# Patient Record
Sex: Male | Born: 1960
Health system: Southern US, Community
[De-identification: ages and names within clinical notes are randomized; demographics above are authoritative.]

## PROBLEM LIST (undated history)

## (undated) DIAGNOSIS — E785 Hyperlipidemia, unspecified: Secondary | ICD-10-CM

## (undated) DIAGNOSIS — I10 Essential (primary) hypertension: Secondary | ICD-10-CM

## (undated) HISTORY — DX: Hyperlipidemia, unspecified: E78.5

## (undated) HISTORY — PX: NO PAST SURGERIES: SHX2092

## (undated) HISTORY — DX: Essential (primary) hypertension: I10

---

## 2004-02-29 ENCOUNTER — Encounter (INDEPENDENT_AMBULATORY_CARE_PROVIDER_SITE_OTHER): Payer: Self-pay | Admitting: Specialist

## 2004-02-29 ENCOUNTER — Other Ambulatory Visit: Admission: RE | Admit: 2004-02-29 | Discharge: 2004-02-29 | Payer: Self-pay | Admitting: Urology

## 2004-02-29 ENCOUNTER — Ambulatory Visit (HOSPITAL_COMMUNITY): Admission: RE | Admit: 2004-02-29 | Discharge: 2004-02-29 | Payer: Self-pay | Admitting: Urology

## 2004-02-29 ENCOUNTER — Ambulatory Visit (HOSPITAL_BASED_OUTPATIENT_CLINIC_OR_DEPARTMENT_OTHER): Admission: RE | Admit: 2004-02-29 | Discharge: 2004-02-29 | Payer: Self-pay | Admitting: Urology

## 2004-07-04 ENCOUNTER — Ambulatory Visit (HOSPITAL_COMMUNITY): Admission: RE | Admit: 2004-07-04 | Discharge: 2004-07-04 | Payer: Self-pay | Admitting: Urology

## 2004-07-04 ENCOUNTER — Encounter (INDEPENDENT_AMBULATORY_CARE_PROVIDER_SITE_OTHER): Payer: Self-pay | Admitting: Specialist

## 2004-07-04 ENCOUNTER — Ambulatory Visit (HOSPITAL_BASED_OUTPATIENT_CLINIC_OR_DEPARTMENT_OTHER): Admission: RE | Admit: 2004-07-04 | Discharge: 2004-07-04 | Payer: Self-pay | Admitting: Urology

## 2006-06-26 ENCOUNTER — Encounter: Admission: RE | Admit: 2006-06-26 | Discharge: 2006-06-26 | Payer: Self-pay | Admitting: Family Medicine

## 2007-08-30 ENCOUNTER — Encounter: Admission: RE | Admit: 2007-08-30 | Discharge: 2007-08-30 | Payer: Self-pay | Admitting: Orthopedic Surgery

## 2011-05-05 NOTE — Op Note (Signed)
NAME:  Richard Butler, Richard Butler                      ACCOUNT NO.:  [id number]   MEDICAL RECORD NO.:  [id number]                   PATIENT TYPE:  AMB   LOCATION:  [facility]                                 FACILITY:  [facility]   PHYSICIAN:  Sigmund I. Patsi Sears, M.D.         DATE OF BIRTH:  1961/04/29   DATE OF PROCEDURE:  07/04/2004  DATE OF DISCHARGE:                                 OPERATIVE REPORT   PREOPERATIVE DIAGNOSIS:  Urethral warts.   POSTOPERATIVE DIAGNOSIS:  Urethral warts.   PROCEDURES PERFORMED:  1. Flexible cystoscopy.  2. Excision of urethral warts using holmium laser.   SURGEON:  Jethro Bolus, M.D.   RESIDENT SURGEON:  Thyra Breed, M.D.   ANESTHESIA:  General endotracheal.   ESTIMATED BLOOD LOSS:  None.   DRAINS:  None.   INDICATION FOR PROCEDURE:  [name] is a 50 year old male with a history  of urethral warts, first resected in 1992.  He then had recurrence in March  of 2005.  He was recently evaluated and found to have recurrent urethral  warts at his fossa navicularis.  He has been consented on the risks,  benefits and alternatives of removal of these warts using the holmium laser.  Informed consent has been obtained.   PROCEDURE IN DETAIL:  Following identification by his arm bracelet, the  patient was brought to the operating room and placed in the supine position.  Here he underwent successful induction of general endotracheal anesthesia  and he was placed in the dorsal lithotomy position.  His perineum and  genitalia were then prepped with Betadine and draped in the usual sterile  fashion.  Flexible cystoscopy was initially carried out to reveal two  urethral warts in the fossa navicularis.  The remainder of the urethra was  without evidence of warts.  Exposing the distal urethra, the base of the  first urethral wart was grasped with a small hemostat, it was then excised.  The base was then further fulgurated using the holmium laser.  The second  wart  was then removed in a similar fashion, again using the holmium laser to  fulgurate the base of the wart in its entirety and obtain excellent  hemostasis.  Following removal of all the wart tissue, we then reinserted  the flexible cystoscope to reveal no evidence of any further warts on the  urothelium.  We then in-and-out cathed the patient with a 14 French red  rubber catheter, this marked termination of procedure.  The patient  tolerated the procedure well and there were no complications.  Please note  that Dr. Patsi Sears was present and participated in the entire procedure as  he was the responsible surgeon.   DISPOSITION:  After awaking from general anesthesia, the patient was  transferred to the postanesthesia care unit in stable condition.  From here,  he will be discharged to home.     Thyra Breed, MD  Sigmund I. Patsi Sears, M.D.    EG/MEDQ  D:  07/04/2004  T:  07/05/2004  Job:  [id number]

## 2011-05-05 NOTE — Op Note (Signed)
NAME:  Richard Butler, Richard Butler                      ACCOUNT NO.:  [id number]   MEDICAL RECORD NO.:  [id number]                   PATIENT TYPE:  AMB   LOCATION:  [facility]                                 FACILITY:  [facility]   PHYSICIAN:  Sigmund I. Patsi Sears, M.D.         DATE OF BIRTH:  07-10-1961   DATE OF PROCEDURE:  02/29/2004  DATE OF DISCHARGE:                                 OPERATIVE REPORT   PREOPERATIVE DIAGNOSIS:  Urethral wart.   POSTOPERATIVE DIAGNOSIS:  Urethral wart.   OPERATION/PROCEDURE:  CO2 laser excision of urethral wart.   SURGEON:  Sigmund I. Patsi Sears, M.D.   ANESTHESIA:  General.   PREPARATION:  After appropriate preanesthesia, the patient was brought to  the operating room and placed on the operating table in the dorsal supine  position where general LMA anesthesia was introduced.  He remained in this  position where the pubis was prepped with Betadine solution and draped in  the usual fashion.   DESCRIPTION OF PROCEDURE:  The distal urethral wart was on a stalk, and use  of the electrocoagulation unit was accomplished to excise the wart.  The CO2  laser was accomplished to vaporize the base of the wart and any remaining  periurethral tissue that appeared to have any wart in it.  The urethra was  probed and there was no further wart noted.  The patient tolerated this  procedure and was awakened and taken to the recovery room in good condition.                                               Sigmund I. Patsi Sears, M.D.    SIT/MEDQ  D:  02/29/2004  T:  02/29/2004  Job:  [id number]

## 2012-12-12 ENCOUNTER — Ambulatory Visit
Admission: RE | Admit: 2012-12-12 | Discharge: 2012-12-12 | Disposition: A | Discharge status: Home / Self Care | Payer: 59 | Source: Ambulatory Visit | Attending: Family Medicine | Admitting: Family Medicine

## 2012-12-12 ENCOUNTER — Other Ambulatory Visit: Payer: Self-pay | Admitting: Family Medicine

## 2012-12-12 DIAGNOSIS — R6889 Other general symptoms and signs: Secondary | ICD-10-CM

## 2013-02-28 ENCOUNTER — Other Ambulatory Visit: Payer: Self-pay | Admitting: Gastroenterology

## 2013-07-23 ENCOUNTER — Ambulatory Visit
Admission: RE | Admit: 2013-07-23 | Discharge: 2013-07-23 | Disposition: A | Discharge status: Home / Self Care | Payer: 59 | Source: Ambulatory Visit | Attending: Family Medicine | Admitting: Family Medicine

## 2013-07-23 ENCOUNTER — Other Ambulatory Visit: Payer: Self-pay | Admitting: Family Medicine

## 2013-07-23 DIAGNOSIS — R509 Fever, unspecified: Secondary | ICD-10-CM

## 2013-07-23 DIAGNOSIS — J189 Pneumonia, unspecified organism: Secondary | ICD-10-CM

## 2013-07-23 DIAGNOSIS — R0602 Shortness of breath: Secondary | ICD-10-CM

## 2013-08-08 ENCOUNTER — Other Ambulatory Visit: Payer: Self-pay | Admitting: Family Medicine

## 2013-08-08 ENCOUNTER — Ambulatory Visit
Admission: RE | Admit: 2013-08-08 | Discharge: 2013-08-08 | Disposition: A | Discharge status: Home / Self Care | Payer: 59 | Source: Ambulatory Visit | Attending: Family Medicine | Admitting: Family Medicine

## 2013-08-08 DIAGNOSIS — J189 Pneumonia, unspecified organism: Secondary | ICD-10-CM

## 2013-08-20 ENCOUNTER — Telehealth: Payer: Self-pay | Admitting: Internal Medicine

## 2013-08-20 NOTE — Telephone Encounter (Signed)
I spoke with pt. appt r/s nothing further needed

## 2013-08-25 ENCOUNTER — Institutional Professional Consult (permissible substitution): Payer: 59 | Admitting: Internal Medicine

## 2013-09-04 ENCOUNTER — Encounter: Payer: Self-pay | Admitting: Internal Medicine

## 2013-09-05 ENCOUNTER — Ambulatory Visit (INDEPENDENT_AMBULATORY_CARE_PROVIDER_SITE_OTHER): Payer: 59 | Admitting: Internal Medicine

## 2013-09-05 ENCOUNTER — Encounter: Payer: Self-pay | Admitting: Internal Medicine

## 2013-09-05 VITALS — BP 130/82 | HR 95 | Temp 98.4°F | Ht 72.0 in | Wt 207.8 lb

## 2013-09-05 DIAGNOSIS — Z23 Encounter for immunization: Secondary | ICD-10-CM

## 2013-09-05 DIAGNOSIS — J189 Pneumonia, unspecified organism: Secondary | ICD-10-CM

## 2013-09-05 NOTE — Progress Notes (Signed)
Subjective:    Patient ID: Richard Butler, male    DOB: 12/29/1960, 52 y.o.   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 Present because of "recurrent pneumonia".  Reports that during his entire childhood and teen years but he always had 2 episodes of sinusitis that occasionally would turn into bronchitis. 7 or 8 years ago he did have one episode of pneumonia. Then in December 2013 had confirmed influenza exposure and himself had influenza. This was followed by development of left lower lobe pneumonia confirmed on chest x-ray 12/12/2012. He says he recovered from that. However those no followup chest x-ray. Then in August 2014 he was a trip to [place] with family and this time did not develop any sinusitis or premonitory respiratory infection but suddenly felt extremely exhausted and weak and presented to his primary care physician early August 2014 and a chest x-ray available for my review 07/23/2013 shows left lower lobe consolidation and pneumonia. Treated with antibiotics and on followup 08/08/2013 chest x-ray available again for my followup for partial resolution of infiltrate. Currently is feeling well and asymptomatic. His main overall health issue is that he needs to lose weight because of hyperlipidemia     Past Medical History  Diagnosis Date  . Hyperlipidemia      Family History  Problem Relation Age of Onset  . Hypertension Father   . Stomach cancer Father   . Heart disease Mother   . Dementia Mother   . Breast cancer Maternal Grandmother   . CAD Father   . Emphysema Father      History   Social History  . Marital Status: Married    Spouse Name: N/A    Number of Children: 1  . Years of Education: N/A   Occupational History  . Attorney    Social History Main Topics  . Smoking status: Former Smoker -- 0.50 packs/day for 15 years    Types: Cigarettes    Quit date: 12/18/2006  . Smokeless tobacco: Not on file   . Alcohol Use: Yes     Comment: occ  . Drug Use: No  . Sexual Activity: Not on file   Other Topics Concern  . Not on file   Social History Narrative  . No narrative on file     Allergies  Allergen Reactions  . Hydrocodone     nightmares  . Tussionex Pennkinetic Er [Hydrocod Polst-Cpm Polst Er]     nightmares     Outpatient Prescriptions Prior to Visit  Medication Sig Dispense Refill  . benzonatate (TESSALON) 100 MG capsule Take 100 mg by mouth 3 (three) times daily as needed for cough.       No facility-administered medications prior to visit.      Review of Systems  Constitutional: Negative for fever and unexpected weight change.  HENT: Negative for ear pain, nosebleeds, congestion, sore throat, rhinorrhea, sneezing, trouble swallowing, dental problem, postnasal drip and sinus pressure.   Eyes: Negative for redness and itching.  Respiratory: Negative for cough, chest tightness, shortness of breath and wheezing.   Cardiovascular: Negative for palpitations and leg swelling.  Gastrointestinal: Negative for nausea and vomiting.  Genitourinary: Negative for dysuria.  Musculoskeletal: Negative for joint swelling.  Skin: Negative for rash.  Neurological: Negative for headaches.  Hematological: Does not bruise/bleed easily.  Psychiatric/Behavioral: Negative for dysphoric mood. The patient is not nervous/anxious.        Objective:   Physical Exam  Nursing note and vitals reviewed. Constitutional: He is oriented to person, place, and time. He appears well-developed and well-nourished. No distress.  HENT:  Head: Normocephalic and atraumatic.  Right Ear: External ear normal.  Left Ear: External ear normal.  Mouth/Throat: Oropharynx is clear and moist. No oropharyngeal exudate.  Eyes: Conjunctivae and EOM are normal. Pupils are equal, round, and reactive to light. Right eye exhibits no discharge. Left eye exhibits no discharge. No scleral icterus.  Neck: Normal range of  motion. Neck supple. No JVD present. No tracheal deviation present. No thyromegaly present.  Cardiovascular: Normal rate, regular rhythm and intact distal pulses.  Exam reveals no gallop and no friction rub.   No murmur heard. Pulmonary/Chest: Effort normal and breath sounds normal. No respiratory distress. He has no wheezes. He has no rales. He exhibits no tenderness.  ? Crackles at base  Abdominal: Soft. Bowel sounds are normal. He exhibits no distension and no mass. There is no tenderness. There is no rebound and no guarding.  Musculoskeletal: Normal range of motion. He exhibits no edema and no tenderness.  Lymphadenopathy:    He has no cervical adenopathy.  Neurological: He is alert and oriented to person, place, and time. He has normal reflexes. No cranial nerve deficit. Coordination normal.  Skin: Skin is warm and dry. No rash noted. He is not diaphoretic. No erythema. No pallor.  Psychiatric: He has a normal mood and affect. His behavior is normal. Judgment and thought content normal.          Assessment & Plan:

## 2013-09-05 NOTE — Patient Instructions (Addendum)
Do ct chest end sept 2014 or early  Oct 2014 Have flu shot 09/05/2013 Will call you with result

## 2013-09-07 DIAGNOSIS — J189 Pneumonia, unspecified organism: Secondary | ICD-10-CM | POA: Insufficient documentation

## 2013-09-07 NOTE — Assessment & Plan Note (Signed)
2  X LLL pneumonia in Dec 2013 and Aug 2014 at same location on CXR. Unclear if there is focoal lesion like bronchiectastis. Currently asymptomatic. Will get followup imaging in the form of CT chest in end sept 2014 or early Oct 2014 . We will call him with results  He is agreeable with plan

## 2013-09-16 ENCOUNTER — Ambulatory Visit (INDEPENDENT_AMBULATORY_CARE_PROVIDER_SITE_OTHER)
Admission: RE | Admit: 2013-09-16 | Discharge: 2013-09-16 | Disposition: A | Discharge status: Home / Self Care | Payer: 59 | Source: Ambulatory Visit | Attending: Internal Medicine | Admitting: Internal Medicine

## 2013-09-16 DIAGNOSIS — J189 Pneumonia, unspecified organism: Secondary | ICD-10-CM

## 2017-01-12 DIAGNOSIS — L02212 Cutaneous abscess of back [any part, except buttock]: Secondary | ICD-10-CM | POA: Diagnosis not present

## 2017-01-12 DIAGNOSIS — L0889 Other specified local infections of the skin and subcutaneous tissue: Secondary | ICD-10-CM | POA: Diagnosis not present

## 2017-02-14 DIAGNOSIS — R112 Nausea with vomiting, unspecified: Secondary | ICD-10-CM | POA: Diagnosis not present

## 2017-02-14 DIAGNOSIS — B349 Viral infection, unspecified: Secondary | ICD-10-CM | POA: Diagnosis not present

## 2017-02-27 DIAGNOSIS — Z0000 Encounter for general adult medical examination without abnormal findings: Secondary | ICD-10-CM | POA: Diagnosis not present

## 2017-03-06 DIAGNOSIS — E784 Other hyperlipidemia: Secondary | ICD-10-CM | POA: Diagnosis not present

## 2017-03-06 DIAGNOSIS — Z1389 Encounter for screening for other disorder: Secondary | ICD-10-CM | POA: Diagnosis not present

## 2017-03-06 DIAGNOSIS — R5383 Other fatigue: Secondary | ICD-10-CM | POA: Diagnosis not present

## 2017-03-08 DIAGNOSIS — Z1212 Encounter for screening for malignant neoplasm of rectum: Secondary | ICD-10-CM | POA: Diagnosis not present

## 2017-05-22 DIAGNOSIS — R52 Pain, unspecified: Secondary | ICD-10-CM | POA: Diagnosis not present

## 2017-05-22 DIAGNOSIS — J069 Acute upper respiratory infection, unspecified: Secondary | ICD-10-CM | POA: Diagnosis not present

## 2017-05-22 DIAGNOSIS — J029 Acute pharyngitis, unspecified: Secondary | ICD-10-CM | POA: Diagnosis not present

## 2017-09-29 DIAGNOSIS — Z23 Encounter for immunization: Secondary | ICD-10-CM | POA: Diagnosis not present

## 2017-10-01 DIAGNOSIS — R0781 Pleurodynia: Secondary | ICD-10-CM | POA: Diagnosis not present

## 2017-10-09 DIAGNOSIS — M9901 Segmental and somatic dysfunction of cervical region: Secondary | ICD-10-CM | POA: Diagnosis not present

## 2017-10-09 DIAGNOSIS — M25511 Pain in right shoulder: Secondary | ICD-10-CM | POA: Diagnosis not present

## 2017-10-09 DIAGNOSIS — M9902 Segmental and somatic dysfunction of thoracic region: Secondary | ICD-10-CM | POA: Diagnosis not present

## 2017-10-11 DIAGNOSIS — M25511 Pain in right shoulder: Secondary | ICD-10-CM | POA: Diagnosis not present

## 2017-10-11 DIAGNOSIS — M9902 Segmental and somatic dysfunction of thoracic region: Secondary | ICD-10-CM | POA: Diagnosis not present

## 2017-10-11 DIAGNOSIS — M9901 Segmental and somatic dysfunction of cervical region: Secondary | ICD-10-CM | POA: Diagnosis not present

## 2017-10-15 DIAGNOSIS — M9902 Segmental and somatic dysfunction of thoracic region: Secondary | ICD-10-CM | POA: Diagnosis not present

## 2017-10-15 DIAGNOSIS — M25511 Pain in right shoulder: Secondary | ICD-10-CM | POA: Diagnosis not present

## 2017-10-15 DIAGNOSIS — M9901 Segmental and somatic dysfunction of cervical region: Secondary | ICD-10-CM | POA: Diagnosis not present

## 2017-12-31 DIAGNOSIS — L821 Other seborrheic keratosis: Secondary | ICD-10-CM | POA: Diagnosis not present

## 2017-12-31 DIAGNOSIS — L820 Inflamed seborrheic keratosis: Secondary | ICD-10-CM | POA: Diagnosis not present

## 2018-03-08 DIAGNOSIS — R82998 Other abnormal findings in urine: Secondary | ICD-10-CM | POA: Diagnosis not present

## 2018-03-08 DIAGNOSIS — Z0000 Encounter for general adult medical examination without abnormal findings: Secondary | ICD-10-CM | POA: Diagnosis not present

## 2018-03-15 DIAGNOSIS — J069 Acute upper respiratory infection, unspecified: Secondary | ICD-10-CM | POA: Diagnosis not present

## 2018-03-15 DIAGNOSIS — Z1389 Encounter for screening for other disorder: Secondary | ICD-10-CM | POA: Diagnosis not present

## 2018-03-15 DIAGNOSIS — R945 Abnormal results of liver function studies: Secondary | ICD-10-CM | POA: Diagnosis not present

## 2018-03-15 DIAGNOSIS — E559 Vitamin D deficiency, unspecified: Secondary | ICD-10-CM | POA: Diagnosis not present

## 2018-03-15 DIAGNOSIS — Z0000 Encounter for general adult medical examination without abnormal findings: Secondary | ICD-10-CM | POA: Diagnosis not present

## 2018-03-15 DIAGNOSIS — Z125 Encounter for screening for malignant neoplasm of prostate: Secondary | ICD-10-CM | POA: Diagnosis not present

## 2018-04-15 DIAGNOSIS — Z8719 Personal history of other diseases of the digestive system: Secondary | ICD-10-CM | POA: Diagnosis not present

## 2019-01-21 DIAGNOSIS — R918 Other nonspecific abnormal finding of lung field: Secondary | ICD-10-CM | POA: Diagnosis not present

## 2019-04-25 DIAGNOSIS — Z0000 Encounter for general adult medical examination without abnormal findings: Secondary | ICD-10-CM | POA: Diagnosis not present

## 2019-05-02 DIAGNOSIS — R82998 Other abnormal findings in urine: Secondary | ICD-10-CM | POA: Diagnosis not present

## 2019-05-07 ENCOUNTER — Other Ambulatory Visit: Payer: Self-pay | Admitting: Internal Medicine

## 2019-05-07 DIAGNOSIS — E785 Hyperlipidemia, unspecified: Secondary | ICD-10-CM

## 2019-06-16 ENCOUNTER — Ambulatory Visit
Admission: RE | Admit: 2019-06-16 | Discharge: 2019-06-16 | Disposition: A | Discharge status: Home / Self Care | Payer: No Typology Code available for payment source | Source: Ambulatory Visit | Attending: Internal Medicine | Admitting: Internal Medicine

## 2019-06-16 DIAGNOSIS — E785 Hyperlipidemia, unspecified: Secondary | ICD-10-CM

## 2020-02-28 ENCOUNTER — Ambulatory Visit: Payer: 59 | Attending: Internal Medicine

## 2020-02-28 DIAGNOSIS — Z23 Encounter for immunization: Secondary | ICD-10-CM

## 2020-02-28 NOTE — Progress Notes (Signed)
   Covid-19 Vaccination Clinic  Name:  Richard Butler    MRN: [id number] DOB: 01/02/1961  02/28/2020  Mr. Plain was observed post Covid-19 immunization for 15 minutes without incident. He was provided with Vaccine Information Sheet and instruction to access the V-Safe system.   [name] was instructed to call 911 with any severe reactions post vaccine: Marland Kitchen Difficulty breathing  . Swelling of face and throat  . A fast heartbeat  . A bad rash all over body  . Dizziness and weakness   Immunizations Administered    Name Date Dose VIS Date Route   Pfizer COVID-19 Vaccine 02/28/2020  1:51 PM 0.3 mL 11/28/2019 Intramuscular   Manufacturer: [employer], [employer]   Lot: [id number]   NDC: [address]-4

## 2020-03-12 ENCOUNTER — Ambulatory Visit: Payer: 59 | Attending: Internal Medicine

## 2020-03-12 DIAGNOSIS — Z20822 Contact with and (suspected) exposure to covid-19: Secondary | ICD-10-CM

## 2020-03-13 LAB — SARS-COV-2, NAA 2 DAY TAT

## 2020-03-13 LAB — NOVEL CORONAVIRUS, NAA: SARS-CoV-2, NAA: DETECTED — AB

## 2020-03-14 ENCOUNTER — Other Ambulatory Visit: Payer: Self-pay | Admitting: Physician Assistant

## 2020-03-14 DIAGNOSIS — U071 COVID-19: Secondary | ICD-10-CM

## 2020-03-14 DIAGNOSIS — I10 Essential (primary) hypertension: Secondary | ICD-10-CM

## 2020-03-14 MED ORDER — SODIUM CHLORIDE 0.9 % IV SOLN
700.0000 mg | Freq: Once | INTRAVENOUS | Status: AC
  Administered 2020-03-15: 700 mg via INTRAVENOUS
  Filled 2020-03-14: qty 700

## 2020-03-14 NOTE — Progress Notes (Signed)
  I connected by phone with Richard Butler on 03/14/2020 at 8:38 AM to discuss the potential use of an new treatment for mild to moderate COVID-19 viral infection in non-hospitalized patients.  This patient is a 59 y.o. male that meets the FDA criteria for Emergency Use Authorization of bamlanivimab or casirivimab\imdevimab.  Has a (+) direct SARS-CoV-2 viral test result  Has mild or moderate COVID-19   Is ? 58 years of age and weighs ? 40 kg  Is NOT hospitalized due to COVID-19  Is NOT requiring oxygen therapy or requiring an increase in baseline oxygen flow rate due to COVID-19  Is within 10 days of symptom onset  Has at least one of the high risk factor(s) for progression to severe COVID-19 and/or hospitalization as defined in EUA.  Specific high risk criteria : Hypertension   I have spoken and communicated the following to the patient or parent/caregiver:  1. FDA has authorized the emergency use of bamlanivimab and casirivimab\imdevimab for the treatment of mild to moderate COVID-19 in adults and pediatric patients with positive results of direct SARS-CoV-2 viral testing who are 88 years of age and older weighing at least 40 kg, and who are at high risk for progressing to severe COVID-19 and/or hospitalization.  2. The significant known and potential risks and benefits of bamlanivimab and casirivimab\imdevimab, and the extent to which such potential risks and benefits are unknown.  3. Information on available alternative treatments and the risks and benefits of those alternatives, including clinical trials.  4. Patients treated with bamlanivimab and casirivimab\imdevimab should continue to self-isolate and use infection control measures (e.g., wear mask, isolate, social distance, avoid sharing personal items, clean and disinfect "high touch" surfaces, and frequent handwashing) according to CDC guidelines.   5. The patient or parent/caregiver has the option to accept or refuse  bamlanivimab or casirivimab\imdevimab .  After reviewing this information with the patient, The patient agreed to proceed with receiving the bamlanimivab infusion and will be provided a copy of the Fact sheet prior to receiving the infusion.   Sx onset 03/11/20. Infusion set up for tomorrow 03/15/20 @ 10:30am. Directions given and orders in.   Cline Crock PA-C 03/14/2020 8:38 AM

## 2020-03-15 ENCOUNTER — Ambulatory Visit (HOSPITAL_COMMUNITY)
Admission: RE | Admit: 2020-03-15 | Discharge: 2020-03-15 | Disposition: A | Discharge status: Home / Self Care | Payer: 59 | Source: Ambulatory Visit | Attending: Pulmonary Disease | Admitting: Pulmonary Disease

## 2020-03-15 DIAGNOSIS — U071 COVID-19: Secondary | ICD-10-CM | POA: Insufficient documentation

## 2020-03-15 DIAGNOSIS — I10 Essential (primary) hypertension: Secondary | ICD-10-CM | POA: Insufficient documentation

## 2020-03-15 MED ORDER — METHYLPREDNISOLONE SODIUM SUCC 125 MG IJ SOLR: 125.0000 mg | Freq: Once | INTRAMUSCULAR | Status: DC | PRN

## 2020-03-15 MED ORDER — DIPHENHYDRAMINE HCL 50 MG/ML IJ SOLN: 50.0000 mg | Freq: Once | INTRAMUSCULAR | Status: DC | PRN

## 2020-03-15 MED ORDER — ALBUTEROL SULFATE HFA 108 (90 BASE) MCG/ACT IN AERS: 2.0000 | INHALATION_SPRAY | Freq: Once | RESPIRATORY_TRACT | Status: DC | PRN

## 2020-03-15 MED ORDER — EPINEPHRINE 0.3 MG/0.3ML IJ SOAJ: 0.3000 mg | Freq: Once | INTRAMUSCULAR | Status: DC | PRN

## 2020-03-15 MED ORDER — FAMOTIDINE IN NACL 20-0.9 MG/50ML-% IV SOLN: 20.0000 mg | Freq: Once | INTRAVENOUS | Status: DC | PRN

## 2020-03-15 MED ORDER — SODIUM CHLORIDE 0.9 % IV SOLN: INTRAVENOUS | Status: DC | PRN

## 2020-03-15 NOTE — Discharge Instructions (Signed)

## 2020-03-15 NOTE — Progress Notes (Signed)
  Diagnosis: COVID-19  Physician: dr wright  Procedure: Covid Infusion Clinic Med: bamlanivimab infusion - Provided patient with bamlanimivab fact sheet for patients, parents and caregivers prior to infusion.  Complications: No immediate complications noted.  Discharge: Discharged home   Jyair Kiraly S Carleigh Buccieri 03/15/2020  

## 2020-03-24 ENCOUNTER — Ambulatory Visit: Payer: No Typology Code available for payment source

## 2020-06-17 ENCOUNTER — Ambulatory Visit: Payer: 59 | Attending: Internal Medicine

## 2020-06-17 DIAGNOSIS — Z23 Encounter for immunization: Secondary | ICD-10-CM

## 2020-06-17 NOTE — Progress Notes (Signed)
   Covid-19 Vaccination Clinic  Name:  Richard Butler    MRN: [id number] DOB: 1961-07-02  06/17/2020  Mr. Richard Butler was observed post Covid-19 immunization for 15 minutes without incident. He was provided with Vaccine Information Sheet and instruction to access the V-Safe system.   Richard Butler was instructed to call 911 with any severe reactions post vaccine: Richard Butler Kitchen Difficulty breathing  . Swelling of face and throat  . A fast heartbeat  . A bad rash all over body  . Dizziness and weakness   Immunizations Administered    Name Date Dose VIS Date Route   Pfizer COVID-19 Vaccine 06/17/2020  4:44 PM 0.3 mL 02/11/2019 Intramuscular   Manufacturer: [employer], [employer]   Lot: [id number]   NDC: [address]-5

## 2020-11-01 IMAGING — CT CT HEART SCORING
3 series · 14 of 20 positions shown, 16 images · non-contrast
Comparison: Chest CT 09/16/2013.

CLINICAL DATA: 58-year-old Caucasian male with family history of
heart disease. Hypertension and hyperlipidemia.

EXAM:
CT HEART FOR CALCIUM SCORING
TECHNIQUE: CT heart was performed using prospective ECG gating.
A non-contrast exam for calcium scoring was performed.
Note that this exam targets the heart and the chest was not imaged
in its entirety.

[Series 2: calcium scoring 2.00 qr36 bestdiast 71% · axial · 0.40mm/px · z∈[+1546,+1616]mm · 4 of 59 slices shown]
[im 12/59  vessel]
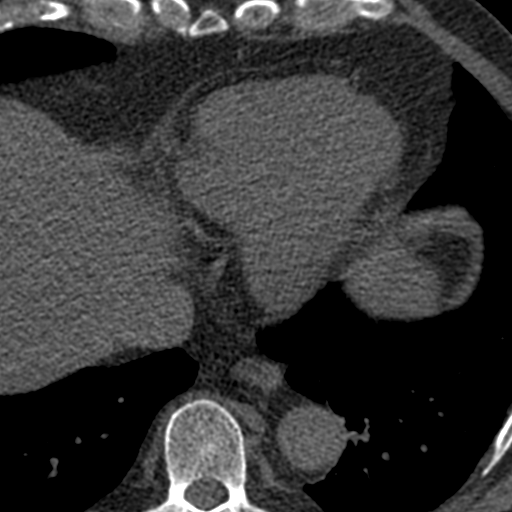
[im 24/59  vessel]
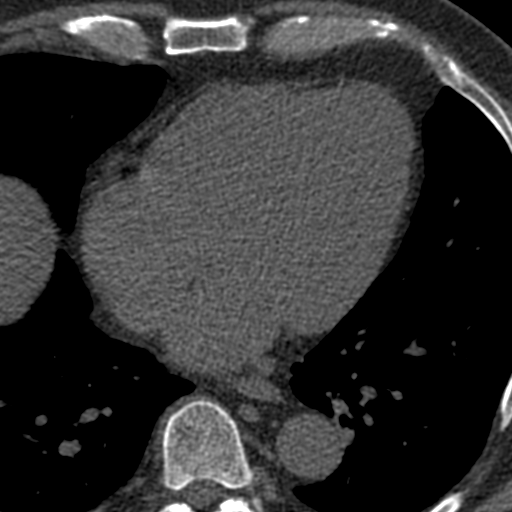
[im 35/59  vessel]
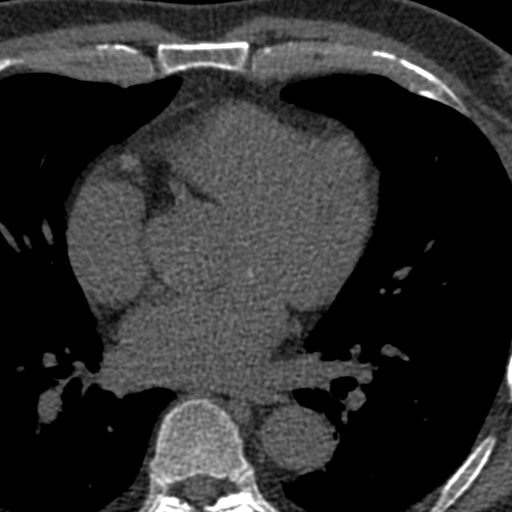
[im 47/59  vessel]
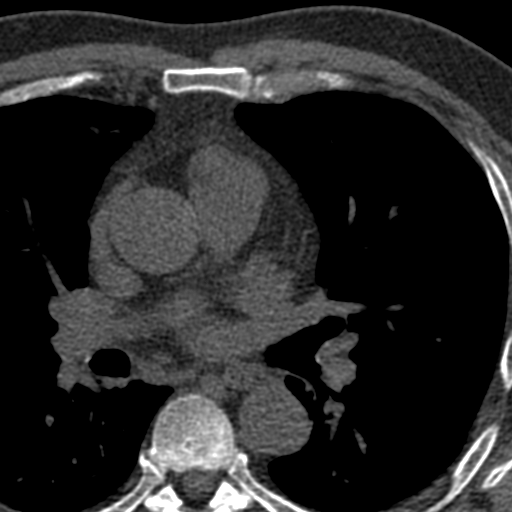

[Series 3: calcium scoring 2.00 br40 bestdiast 71% fov · axial · 0.62mm/px · z∈[+1540,+1620]mm · 5 of 60 slices shown, 7 images]
[im 10/60  vessel]
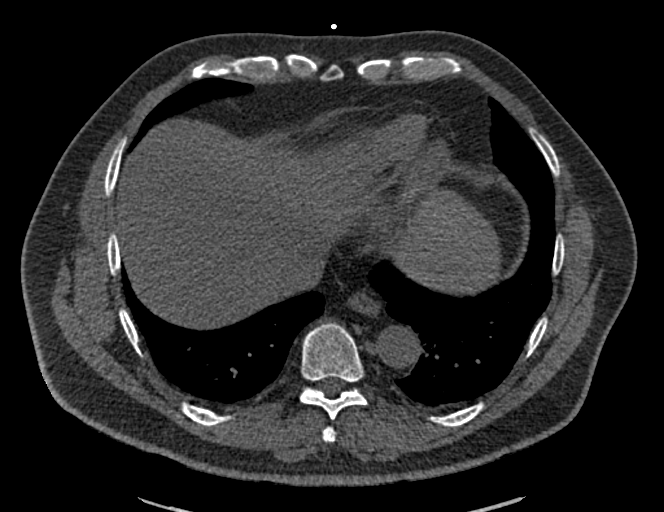
[im 10/60  lung]
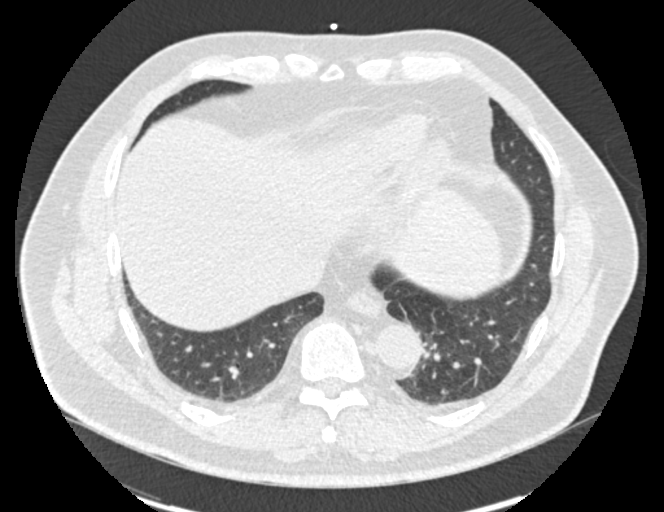
[im 20/60  vessel]
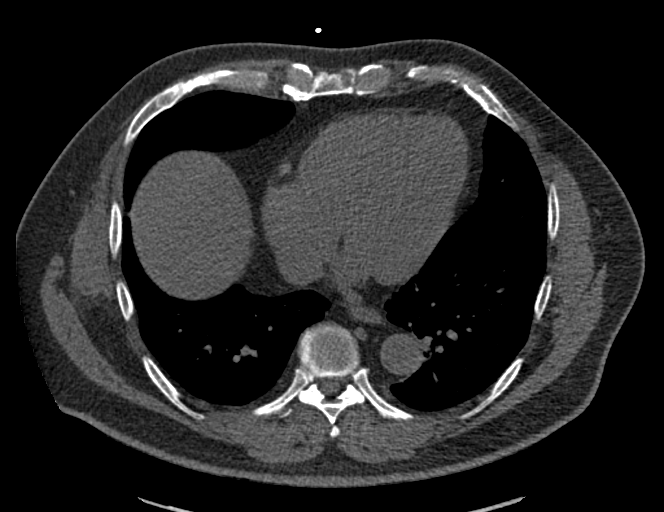
[im 30/60  vessel]
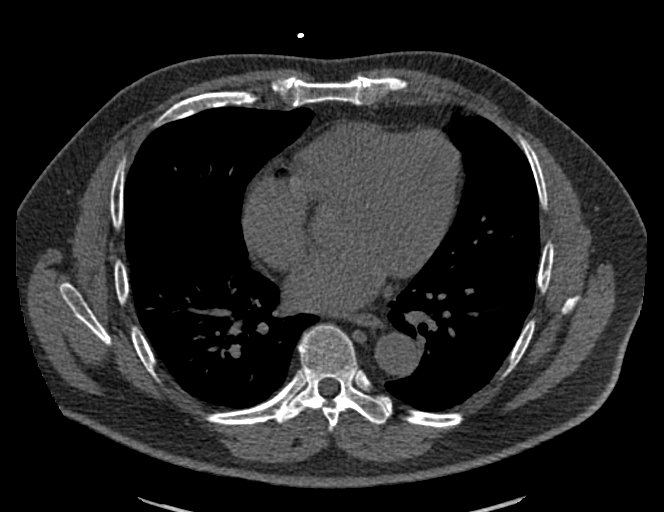
[im 40/60  vessel]
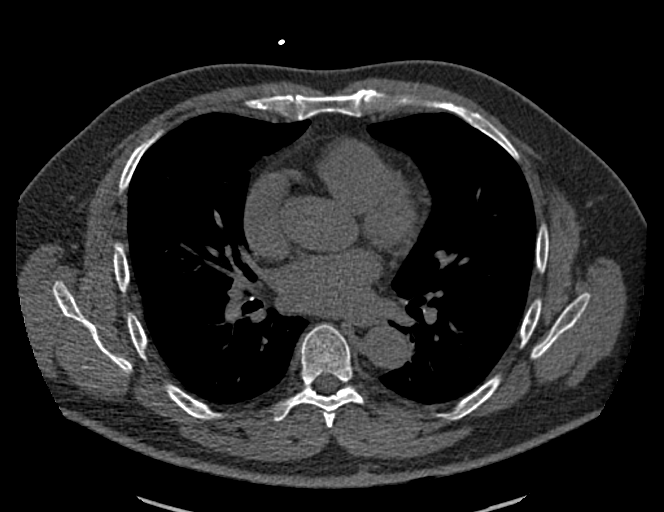
[im 50/60  vessel]
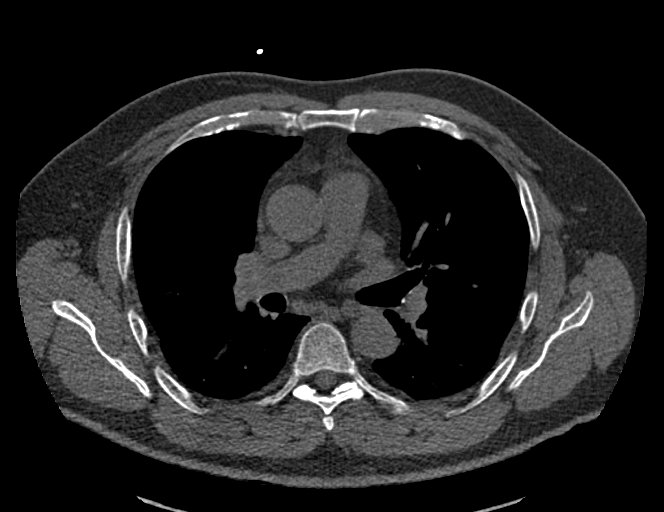
[im 50/60  lung]
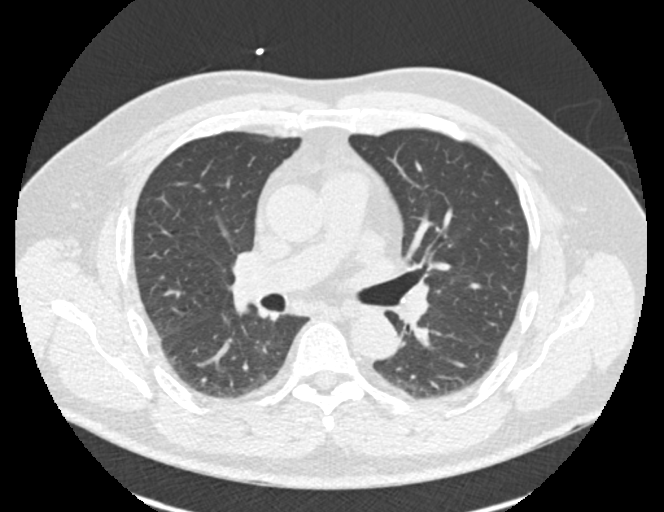

[Series 9: calcium scoring 2.00 br60 bestdiast 71% fov · axial · 0.62mm/px · z∈[+1542,+1620]mm · 5 of 59 slices shown]
[im 10/59  vessel]
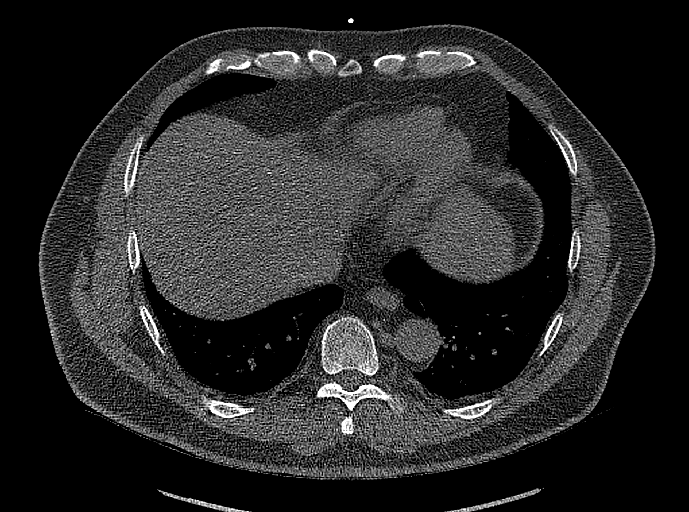
[im 20/59  vessel]
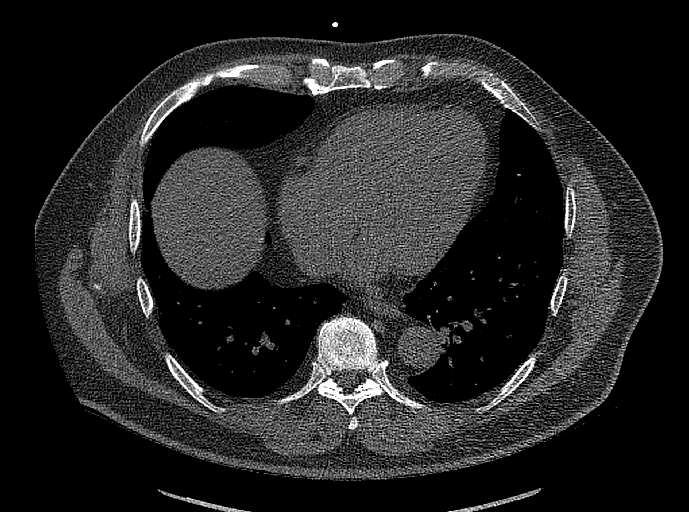
[im 30/59  vessel]
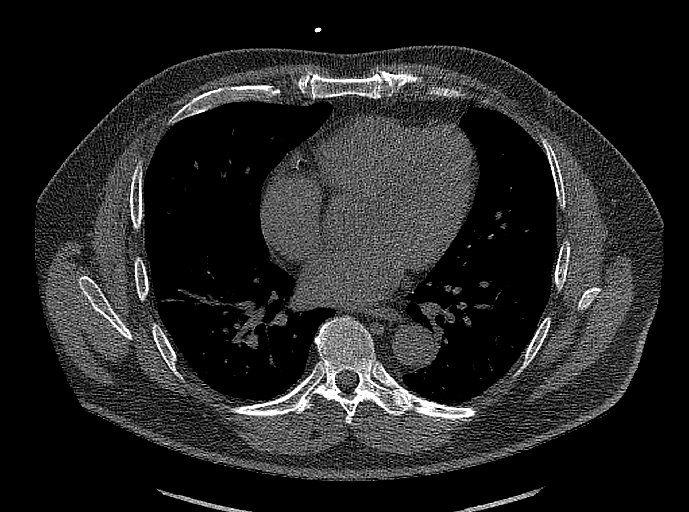
[im 39/59  vessel]
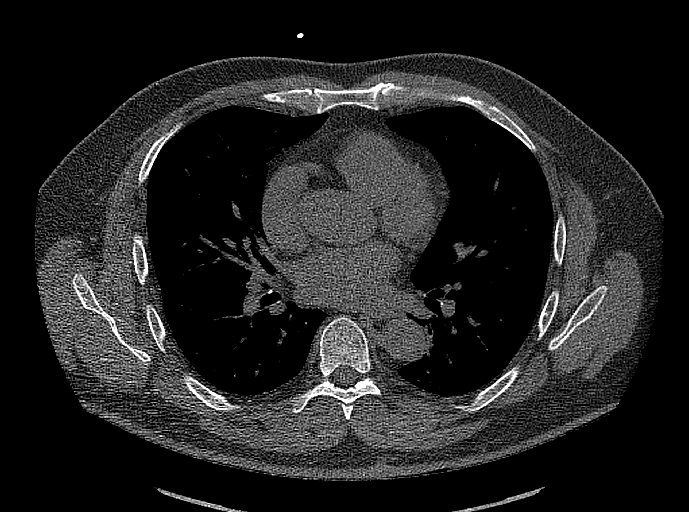
[im 49/59  vessel]
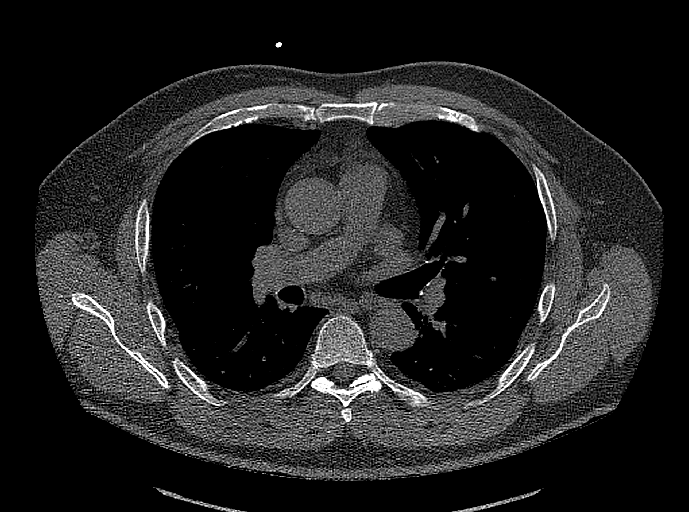

[14 of 20 positions shown; findings below may reference images not displayed]

FINDINGS: Technical quality: Good.

CORONARY CALCIUM

Total Agatston Score: 19

[HOSPITAL] percentile:  53rd

OTHER FINDINGS:

Within the visualized portions of the thorax there are no suspicious
appearing pulmonary nodules or masses, there is no acute
consolidative airspace disease, no pleural effusions, no
pneumothorax and no lymphadenopathy. Visualized portions of the
upper abdomen demonstrates diffuse low attenuation throughout the
visualized hepatic parenchyma, indicative of hepatic steatosis.
There are no aggressive appearing lytic or blastic lesions noted in
the visualized portions of the skeleton.
IMPRESSION: 1. Patient's total coronary artery calcium score is 19 which is 53rd
percentile for patient's of matched age, gender and race/ethnicity.
2. Hepatic steatosis.

## 2021-10-30 NOTE — Progress Notes (Signed)
Cardiology Office Note:    Date:  10/31/2021   ID:  Richard Butler, DOB [birth year], MRN [id number]  PCP:  Rodrigo Ran, MD  Cardiologist:  None   Referring MD: Rodrigo Ran, MD   Chief Complaint  Patient presents with   Shortness of Breath   Follow-up    Right bundle branch block, new Hyperlipidemia Fatty steatosis   Coronary Artery Disease     History of Present Illness:    Richard Butler is a 60 y.o. male with a hx of prior tobacco use, asymptomatic CAD (+CAC score 2020), hyperlipidemia, fatty steatosis, prediabetes, obesity, right bundle branch block,,and otherwise unremarkable and ED.    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  Does not have time to exercise.  Noticed while carrying a TV downstairs with a friend that he became significantly short of breath and felt his heart pounding by the time the TV had been taken to the final destination.  There was no associated chest discomfort.  This was way more physical activity than he would ordinarily participate in.  Family history of vascular disease with mother requiring a pacemaker and father having multiple stents.  Father died at age 15.  He has a brother that does not have vascular disease that he is aware of.  He does not smoke.  He does have fatty steatosis of the liver.  Hyperlipidemia has recently been managed with Repatha.  He was seen greater than 10 years ago for cardiac checkup.  An exercise treadmill test at that time was unremarkable [name]-[name]).  He is a friend of [name] [facility])  Past Medical History:  Diagnosis Date   Hyperlipidemia    Hypertension     Past Surgical History:  Procedure Laterality Date   NO PAST SURGERIES      Current Medications: Current Meds  Medication Sig   REPATHA SURECLICK 140 MG/ML SOAJ Inject into the skin. Pt take injection once every two weeks.     Allergies:   Atorvastatin, Hydrocodone, and Tussionex pennkinetic er  [name] polst-cpm polst er]   Social History   Socioeconomic History   Marital status: Married    Spouse name: Not on file   Number of children: 1   Years of education: Not on file   Highest education level: Not on file  Occupational History   Occupation: Attorney  Tobacco Use   Smoking status: Former    Packs/day: 0.50    Years: 15.00    Pack years: 7.50    Types: Cigarettes    Quit date: 12/18/2006    Years since quitting: 14.8   Smokeless tobacco: Never  Substance and Sexual Activity   Alcohol use: Yes    Comment: occ   Drug use: No   Sexual activity: Not on file  Other Topics Concern   Not on file  Social History Narrative   Not on file   Social Determinants of Health   Financial Resource Strain: Not on file  Food Insecurity: Not on file  Transportation Needs: Not on file  Physical Activity: Not on file  Stress: Not on file  Social Connections: Not on file     Family History: The patient's family history includes Breast cancer in his maternal grandmother; CAD in his father; Dementia in his mother; Emphysema in his father; Heart disease in his mother; Hypertension in his father; Stomach cancer in his father.  ROS:   Please see the history of present  illness.    A lot of stress at work.  His partner died of pancreatic cancer.  He is habitually overworked.  No time to exercise or to have downtime.  All other systems reviewed and are negative.  EKGs/Labs/Other Studies Reviewed:    The following studies were reviewed today:  CORONARY CALCIUM SCORE 05/2019: IMPRESSION: 1. Patient's total coronary artery calcium score is 19 which is 53rd percentile for patient's of matched age, gender and race/ethnicity. 2. Hepatic steatosis.    EKG:  EKG sinus rhythm, right bundle branch block, otherwise normal.  No old or tracings to compare.  Recent Labs: No results found for requested labs within last 8760 hours.  Recent Lipid Panel No results found for: CHOL, TRIG,  HDL, CHOLHDL, VLDL, LDLCALC, LDLDIRECT  Physical Exam:    VS:  BP 124/88   Pulse 90   Ht 6' (1.829 m)   Wt 223 lb 12.8 oz (101.5 kg)   SpO2 95%   BMI 30.35 kg/m     Wt Readings from Last 3 Encounters:  10/31/21 223 lb 12.8 oz (101.5 kg)  09/05/13 207 lb 12.8 oz (94.3 kg)     GEN: Overweight. No acute distress HEENT: Normal NECK: No JVD. LYMPHATICS: No lymphadenopathy CARDIAC: No murmur. RRR no gallop, or edema. VASCULAR:  Normal Pulses. No bruits. RESPIRATORY:  Clear to auscultation without rales, wheezing or rhonchi  ABDOMEN: Soft, non-tender, non-distended, No pulsatile mass, MUSCULOSKELETAL: No deformity  SKIN: Warm and dry NEUROLOGIC:  Alert and oriented x 3 PSYCHIATRIC:  Normal affect   ASSESSMENT:    1. Coronary artery disease involving native coronary artery of native heart without angina pectoris   2. Essential hypertension   3. Prediabetes   4. Hyperlipidemia LDL goal <70   5. Nonalcoholic fatty liver disease without nonalcoholic steatohepatitis (NASH)   6. Right bundle branch block    PLAN:    In order of problems listed above:  Primary prevention discussed.  Applauded Repatha use.  We will plan stress nuclear study to rule out ischemia given known elevated coronary calcium score in 2020. Borderline diastolic at 88 mmHg.  Low-salt diet, weight loss, and exercise discussed.  We will see how his blood pressure does on the treadmill up to determine if he needs antihypertensive therapy. No data to support prediabetes. Continue Repatha.  Needs LDL less than 70 given asymptomatic known CAD A risk factor for CAD.  Discussed weight loss and exercise. A new finding.  Echocardiography to rule out right heart structural abnormality or other significant finding not easily detectable by auscultation.   There was a scheduling misunderstanding.  The patient thought he was coming for a treadmill test.  He was actually scheduled as a cardiology consultation/new patient.  I  apologize.  I am not sure where the mixup occurred and whether was here or from the referring and.  Medication Adjustments/Labs and Tests Ordered: Current medicines are reviewed at length with the patient today.  Concerns regarding medicines are outlined above.  Orders Placed This Encounter  Procedures   EKG 12-Lead   No orders of the defined types were placed in this encounter.   There are no Patient Instructions on file for this visit.   Signed, Lesleigh Noe, MD  10/31/2021 10:27 AM    [facility]

## 2021-10-31 ENCOUNTER — Other Ambulatory Visit: Payer: Self-pay

## 2021-10-31 ENCOUNTER — Encounter: Payer: Self-pay | Admitting: *Deleted

## 2021-10-31 ENCOUNTER — Encounter: Payer: Self-pay | Admitting: Interventional Cardiology

## 2021-10-31 ENCOUNTER — Ambulatory Visit (INDEPENDENT_AMBULATORY_CARE_PROVIDER_SITE_OTHER): Payer: 59 | Admitting: Interventional Cardiology

## 2021-10-31 VITALS — BP 124/88 | HR 90 | Ht 72.0 in | Wt 223.8 lb

## 2021-10-31 DIAGNOSIS — I10 Essential (primary) hypertension: Secondary | ICD-10-CM | POA: Diagnosis not present

## 2021-10-31 DIAGNOSIS — I2510 Atherosclerotic heart disease of native coronary artery without angina pectoris: Secondary | ICD-10-CM

## 2021-10-31 DIAGNOSIS — I4510 Unspecified right bundle-branch block: Secondary | ICD-10-CM

## 2021-10-31 DIAGNOSIS — R0609 Other forms of dyspnea: Secondary | ICD-10-CM

## 2021-10-31 DIAGNOSIS — E785 Hyperlipidemia, unspecified: Secondary | ICD-10-CM

## 2021-10-31 DIAGNOSIS — K760 Fatty (change of) liver, not elsewhere classified: Secondary | ICD-10-CM

## 2021-10-31 DIAGNOSIS — R7303 Prediabetes: Secondary | ICD-10-CM | POA: Diagnosis not present

## 2021-10-31 NOTE — Patient Instructions (Signed)
Medication Instructions:  Your physician recommends that you continue on your current medications as directed. Please refer to the Current Medication list given to you today.  *If you need a refill on your cardiac medications before your next appointment, please call your pharmacy*   Lab Work: None  If you have labs (blood work) drawn today and your tests are completely normal, you will receive your results only by: MyChart Message (if you have MyChart) OR A paper copy in the mail If you have any lab test that is abnormal or we need to change your treatment, we will call you to review the results.   Testing/Procedures: Your physician has requested that you have an echocardiogram. Echocardiography is a painless test that uses sound waves to create images of your heart. It provides your doctor with information about the size and shape of your heart and how well your heart's chambers and valves are working. This procedure takes approximately one hour. There are no restrictions for this procedure.  Your physician has requested that you have en exercise stress myoview. For further information please visit [email or web address] Please follow instruction sheet, as given.   Follow-Up: At [facility], you and your health needs are our priority.  As part of our continuing mission to provide you with exceptional heart care, we have created designated Provider Care Teams.  These Care Teams include your primary Cardiologist (physician) and Advanced Practice Providers (APPs -  Physician Assistants and Nurse Practitioners) who all work together to provide you with the care you need, when you need it.  We recommend signing up for the patient portal called "MyChart".  Sign up information is provided on this After Visit Summary.  MyChart is used to connect with patients for Virtual Visits (Telemedicine).  Patients are able to view lab/test results, encounter notes, upcoming appointments, etc.  Non-urgent  messages can be sent to your provider as well.   To learn more about what you can do with MyChart, go to [email or web address].    Your next appointment:   As needed  The format for your next appointment:   In Person  Provider:   Celso Sickle, MD    Other Instructions

## 2021-11-17 ENCOUNTER — Telehealth (HOSPITAL_COMMUNITY): Payer: Self-pay

## 2021-11-17 NOTE — Telephone Encounter (Signed)
Detailed instructions left on the patient's answering machine. Asked to call back with any questions. S.Nikki Glanzer EMTP 

## 2021-11-24 ENCOUNTER — Other Ambulatory Visit (HOSPITAL_COMMUNITY): Payer: 59

## 2021-11-24 ENCOUNTER — Encounter (HOSPITAL_COMMUNITY): Payer: 59

## 2021-12-14 ENCOUNTER — Telehealth (HOSPITAL_COMMUNITY): Payer: Self-pay | Admitting: *Deleted

## 2021-12-14 NOTE — Telephone Encounter (Signed)
Left message on voicemail per DPR in reference to upcoming appointment scheduled on 12/21/2021 at 10:15 with detailed instructions given per Myocardial Perfusion Study Information Sheet for the test. LM to arrive 15 minutes early, and that it is imperative to arrive on time for appointment to keep from having the test rescheduled. If you need to cancel or reschedule your appointment, please call the office within 24 hours of your appointment. Failure to do so may result in a cancellation of your appointment, and a $50 no show fee. Phone number given for call back for any questions.

## 2021-12-21 ENCOUNTER — Other Ambulatory Visit: Payer: Self-pay

## 2021-12-21 ENCOUNTER — Ambulatory Visit (HOSPITAL_BASED_OUTPATIENT_CLINIC_OR_DEPARTMENT_OTHER): Payer: 59

## 2021-12-21 ENCOUNTER — Ambulatory Visit (HOSPITAL_COMMUNITY): Payer: 59 | Attending: Cardiology

## 2021-12-21 DIAGNOSIS — I4510 Unspecified right bundle-branch block: Secondary | ICD-10-CM

## 2021-12-21 DIAGNOSIS — R0609 Other forms of dyspnea: Secondary | ICD-10-CM | POA: Diagnosis present

## 2021-12-21 DIAGNOSIS — I2510 Atherosclerotic heart disease of native coronary artery without angina pectoris: Secondary | ICD-10-CM | POA: Diagnosis present

## 2021-12-21 LAB — MYOCARDIAL PERFUSION IMAGING
Angina Index: 0
Duke Treadmill Score: 10
Estimated workload: 10.9
Exercise duration (min): 9 min
Exercise duration (sec): 30 s
LV dias vol: 60 mL (ref 62–150)
LV sys vol: 24 mL
MPHR: 161 {beats}/min
Nuc Stress EF: 60 %
Peak HR: 157 {beats}/min
Percent HR: 97 %
Rest HR: 94 {beats}/min
Rest Nuclear Isotope Dose: 10.3 mCi
SDS: 0
SRS: 0
SSS: 0
ST Depression (mm): 0 mm
Stress Nuclear Isotope Dose: 31.1 mCi
TID: 0.83

## 2021-12-21 LAB — ECHOCARDIOGRAM COMPLETE
Area-P 1/2: 3.72 cm2
Height: 72 in
S' Lateral: 2.7 cm
Weight: 3568 oz

## 2021-12-21 MED ORDER — TECHNETIUM TC 99M TETROFOSMIN IV KIT
10.3000 | PACK | Freq: Once | INTRAVENOUS | Status: AC | PRN
  Administered 2021-12-21: 10.3 via INTRAVENOUS
  Filled 2021-12-21: qty 11

## 2021-12-21 MED ORDER — TECHNETIUM TC 99M TETROFOSMIN IV KIT
31.1000 | PACK | Freq: Once | INTRAVENOUS | Status: AC | PRN
  Administered 2021-12-21: 31.1 via INTRAVENOUS
  Filled 2021-12-21: qty 32

## 2023-10-22 ENCOUNTER — Other Ambulatory Visit: Payer: Self-pay | Admitting: Orthopedic Surgery

## 2023-10-22 ENCOUNTER — Encounter: Payer: Self-pay | Admitting: Orthopedic Surgery

## 2023-10-22 DIAGNOSIS — M25512 Pain in left shoulder: Secondary | ICD-10-CM

## 2023-10-24 ENCOUNTER — Ambulatory Visit
Admission: RE | Admit: 2023-10-24 | Discharge: 2023-10-24 | Disposition: A | Discharge status: Home / Self Care | Payer: 59 | Source: Ambulatory Visit | Attending: Orthopedic Surgery | Admitting: Orthopedic Surgery

## 2023-10-24 DIAGNOSIS — M25512 Pain in left shoulder: Secondary | ICD-10-CM
